# Patient Record
Sex: Female | Born: 1935 | Race: White | Hispanic: No | Marital: Single | Smoking: Current every day smoker
Health system: Southern US, Community
[De-identification: ages and names within clinical notes are randomized; demographics above are authoritative.]

## PROBLEM LIST (undated history)

## (undated) DIAGNOSIS — J449 Chronic obstructive pulmonary disease, unspecified: Secondary | ICD-10-CM

---

## 2011-02-16 ENCOUNTER — Encounter: Payer: Self-pay | Admitting: Adult Health

## 2011-02-16 ENCOUNTER — Emergency Department (HOSPITAL_COMMUNITY)
Admission: EM | Admit: 2011-02-16 | Discharge: 2011-02-16 | Disposition: A | Discharge status: Home / Self Care | Payer: Medicare PPO | Attending: Emergency Medicine | Admitting: Emergency Medicine

## 2011-02-16 DIAGNOSIS — R6889 Other general symptoms and signs: Secondary | ICD-10-CM | POA: Insufficient documentation

## 2011-02-16 DIAGNOSIS — J4489 Other specified chronic obstructive pulmonary disease: Secondary | ICD-10-CM | POA: Insufficient documentation

## 2011-02-16 DIAGNOSIS — R51 Headache: Secondary | ICD-10-CM | POA: Insufficient documentation

## 2011-02-16 DIAGNOSIS — J449 Chronic obstructive pulmonary disease, unspecified: Secondary | ICD-10-CM | POA: Insufficient documentation

## 2011-02-16 HISTORY — DX: Chronic obstructive pulmonary disease, unspecified: J44.9

## 2011-02-16 MED ORDER — TRAMADOL HCL 50 MG PO TABS: 50.0000 mg | ORAL_TABLET | Freq: Four times a day (QID) | ORAL | Status: AC | PRN

## 2011-02-16 MED ORDER — METOCLOPRAMIDE HCL 10 MG PO TABS: 10.0000 mg | ORAL_TABLET | Freq: Four times a day (QID) | ORAL | Status: AC

## 2011-02-16 NOTE — ED Provider Notes (Signed)
History     CSN: [id number] Arrival date & time: 02/16/2011 12:07 PM   First MD Initiated Contact with Patient 02/16/11 1209      Chief Complaint  Patient presents with  . Ear Fullness  . Headache    (Consider location/radiation/quality/duration/timing/severity/associated sxs/prior treatment) HPI 77 year old female had onset last night of severe pain in the right side of her scalp posteriorly. The pain is sharp. Current pain is 7/10 but it has been as severe as 10 out of 10. Any kind of pressure makes the pain worse-even just brushing her hair. Nothing seems to make it any better. She has not taken any medication for it. She's also had a cold for the last several days with some slight rhinorrhea and a nonproductive cough. There's been no fever, chills, sweats. She has had no hearing loss. There's been no tinnitus. Past Medical History  Diagnosis Date  . COPD (chronic obstructive pulmonary disease)   . Asthma     History reviewed. No pertinent past surgical history.  History reviewed. No pertinent family history.  History  Substance Use Topics  . Smoking status: Current Everyday Smoker  . Smokeless tobacco: Not on file  . Alcohol Use: No    OB History    Grav Para Term Preterm Abortions TAB SAB Ect Mult Living                  Review of Systems  Allergies  Codeine; Penicillins; Percocet; Sulfa antibiotics; and Vicodin  Home Medications   Current Outpatient Rx  Name Route Sig Dispense Refill  . ALBUTEROL SULFATE (2.5 MG/3ML) 0.083% IN NEBU Nebulization Take 2.5 mg by nebulization every 6 (six) hours as needed.      Marland Kitchen TIOTROPIUM BROMIDE MONOHYDRATE 18 MCG IN CAPS Inhalation Place 18 mcg into inhaler and inhale daily.      . TRAMADOL HCL 50 MG PO TABS Oral Take 1 tablet (50 mg total) by mouth every 6 (six) hours as needed for pain. Maximum dose= 8 tablets per day 20 tablet 0    BP 128/63  Pulse 72  Temp(Src) 98.1 F (36.7 C) (Oral)  Resp 20  SpO2  95%  Physical Exam 77 year old female in no acute distress. Vital signs are normal. Head is normocephalic and atraumatic. No rashes seen on the scalp, but tenderness is noted just partying her hair to try and look at the scalp. TMs are clear. Oropharynx is clear. Neck is supple without adenopathy or JVD. Lungs are clear without rales, wheezes, or rhonchi. Heart has regular rate and rhythm. Abdomen is soft, flat, nontender without masses or hepatosplenomegaly. Extremities have no cyanosis or edema, full range of motion present. Skin is without rashes. Neurologic: Mental status is normal, cranial nerves are intact, there no motor or sensory deficits. ED Course  Procedures (including critical care time)  Labs Reviewed - No data to display No results found.   1. Pain of scalp       MDM  Neuritic type pain which could be related to a radiculopathy or early shingles oh before the rash appears.        Dione Booze, MD 02/16/11 [phone]

## 2017-06-28 ENCOUNTER — Encounter (HOSPITAL_BASED_OUTPATIENT_CLINIC_OR_DEPARTMENT_OTHER): Payer: Self-pay | Admitting: Emergency Medicine

## 2017-06-28 ENCOUNTER — Emergency Department (HOSPITAL_BASED_OUTPATIENT_CLINIC_OR_DEPARTMENT_OTHER)
Admission: EM | Admit: 2017-06-28 | Discharge: 2017-06-28 | Disposition: A | Discharge status: Home / Self Care | Payer: Medicare HMO | Attending: Emergency Medicine | Admitting: Emergency Medicine

## 2017-06-28 ENCOUNTER — Other Ambulatory Visit: Payer: Self-pay

## 2017-06-28 ENCOUNTER — Emergency Department (HOSPITAL_BASED_OUTPATIENT_CLINIC_OR_DEPARTMENT_OTHER): Payer: Medicare HMO

## 2017-06-28 DIAGNOSIS — W010XXA Fall on same level from slipping, tripping and stumbling without subsequent striking against object, initial encounter: Secondary | ICD-10-CM | POA: Insufficient documentation

## 2017-06-28 DIAGNOSIS — S6982XA Other specified injuries of left wrist, hand and finger(s), initial encounter: Secondary | ICD-10-CM | POA: Diagnosis present

## 2017-06-28 DIAGNOSIS — S52592A Other fractures of lower end of left radius, initial encounter for closed fracture: Secondary | ICD-10-CM | POA: Diagnosis not present

## 2017-06-28 DIAGNOSIS — F17210 Nicotine dependence, cigarettes, uncomplicated: Secondary | ICD-10-CM | POA: Insufficient documentation

## 2017-06-28 DIAGNOSIS — Y999 Unspecified external cause status: Secondary | ICD-10-CM | POA: Insufficient documentation

## 2017-06-28 DIAGNOSIS — Y929 Unspecified place or not applicable: Secondary | ICD-10-CM | POA: Insufficient documentation

## 2017-06-28 DIAGNOSIS — Y939 Activity, unspecified: Secondary | ICD-10-CM | POA: Insufficient documentation

## 2017-06-28 DIAGNOSIS — J449 Chronic obstructive pulmonary disease, unspecified: Secondary | ICD-10-CM | POA: Insufficient documentation

## 2017-06-28 DIAGNOSIS — Z79899 Other long term (current) drug therapy: Secondary | ICD-10-CM | POA: Diagnosis not present

## 2017-06-28 MED ORDER — ACETAMINOPHEN 500 MG PO TABS
1000.0000 mg | ORAL_TABLET | Freq: Once | ORAL | Status: AC
  Administered 2017-06-28: 1000 mg via ORAL
  Filled 2017-06-28: qty 2

## 2017-06-28 NOTE — ED Notes (Signed)
No other signs of obvious trauma on head to toe exam except for what's documented below.

## 2017-06-28 NOTE — ED Notes (Signed)
Padded board splint applied to LUE

## 2017-06-28 NOTE — ED Provider Notes (Signed)
[facility] EMERGENCY DEPARTMENT Provider Note   CSN: [id number] Arrival date & time: 06/28/17  1231     History   Chief Complaint Chief Complaint  Patient presents with  . Fall    HPI Whitney Hunter is a 84 y.o. female.  Pt presents to the ED today with fall and left wrist pain.  The pt said she tried to catch herself with her left wrist and landed on it.  She denies any other injury.  She thinks her foot got caught in the rug.  She did not hit her head.  No loc.  No blood thinners.     Past Medical History:  Diagnosis Date  . Asthma   . COPD (chronic obstructive pulmonary disease) (HCC)     There are no active problems to display for this patient.   History reviewed. No pertinent surgical history.   OB History   None      Home Medications    Prior to Admission medications   Medication Sig Start Date End Date Taking? Authorizing Provider  acetaminophen (TYLENOL ARTHRITIS PAIN) 650 MG CR tablet Take 650 mg by mouth every 8 (eight) hours as needed. For pain. [employer] brand     [provider]  albuterol (PROVENTIL) (2.5 MG/3ML) 0.083% nebulizer solution Take 2.5 mg by nebulization every 6 (six) hours as needed.      [provider]  calcium-vitamin D (OSCAL WITH D) 500-200 MG-UNIT per tablet Take 1 tablet by mouth daily.      [provider]  fish oil-omega-3 fatty acids 1000 MG capsule Take 3 g by mouth 2 (two) times daily.      [provider]  FLAXSEED, LINSEED, PO Take 1 tablet by mouth daily.      [provider]  glucosamine-chondroitin 500-400 MG tablet Take 1 tablet by mouth daily.      [provider]  Multiple Vitamin (MULITIVITAMIN WITH MINERALS) TABS Take 1 tablet by mouth daily.      [provider]  tiotropium (SPIRIVA) 18 MCG inhalation capsule Place 18 mcg into inhaler and inhale daily.      [provider]  vitamin C (ASCORBIC ACID) 500 MG tablet Take 500 mg by mouth  daily.      [provider]    Family History History reviewed. No pertinent family history.  Social History Social History   Tobacco Use  . Smoking status: Current Every Day Smoker  Substance Use Topics  . Alcohol use: No  . Drug use: Not on file     Allergies   Codeine; Penicillins; Percocet [oxycodone-acetaminophen]; Sulfa antibiotics; and Vicodin [hydrocodone-acetaminophen]   Review of Systems Review of Systems  Musculoskeletal:       Left wrist deformity and pain  All other systems reviewed and are negative.    Physical Exam Updated Vital Signs BP (!) 159/84 (BP Location: Right Arm)   Pulse 74   Temp 98.2 F (36.8 C) (Oral)   Resp 16   Ht 5' (1.524 m)   Wt 54.4 kg (120 lb)   SpO2 97%   BMI 23.44 kg/m   Physical Exam  Constitutional: She is oriented to person, place, and time. She appears well-developed and well-nourished.  HENT:  Head: Normocephalic and atraumatic.  Right Ear: External ear normal.  Left Ear: External ear normal.  Nose: Nose normal.  Mouth/Throat: Oropharynx is clear and moist.  Eyes: Pupils are equal, round, and reactive to light. Conjunctivae and EOM are normal.  Neck: Normal range of motion. Neck supple.  Cardiovascular: Normal rate, regular rhythm, normal heart sounds and intact distal pulses.  Pulmonary/Chest: Effort normal and breath sounds normal.  Abdominal: Soft. Bowel sounds are normal.  Musculoskeletal:       Left wrist: She exhibits bony tenderness, swelling and deformity.  Neurological: She is alert and oriented to person, place, and time.  Skin: Skin is warm. Capillary refill takes less than 2 seconds.  Psychiatric: She has a normal mood and affect. Her behavior is normal. Judgment and thought content normal.  Nursing note and vitals reviewed.    ED Treatments / Results  Labs (all labs ordered are listed, but only abnormal results are displayed) Labs Reviewed - No data to  display  EKG None  Radiology Dg Wrist Complete Left  Result Date: 06/28/2017 CLINICAL DATA:  Fall EXAM: LEFT WRIST - COMPLETE 3+ VIEW COMPARISON:  None. FINDINGS: Osteopenia. Acute fracture of the distal radius. It is transverse and across the proximal metaphysis. There is dorsal angulation of the distal radius articular surface. Ulna is intact. Scaphoid is grossly intact. IMPRESSION: Acute distal radius fracture. Electronically Signed   By: Jolaine Click M.D.   On: 06/28/2017 13:58    Procedures .Splint Application Date/Time: 06/28/2017 2:22 PM Performed by: Jacalyn Lefevre, MD Authorized by: Jacalyn Lefevre, MD   Consent:    Consent obtained:  Verbal   Consent given by:  Patient   Risks discussed:  Discoloration, numbness, pain and swelling   Alternatives discussed:  No treatment Pre-procedure details:    Sensation:  Normal Procedure details:    Laterality:  Left   Location:  Wrist   Wrist:  L wrist   Strapping: no     Splint type:  Sugar tong   Supplies:  Sling Post-procedure details:    Pain:  Improved   Patient tolerance of procedure:  Tolerated well, no immediate complications   (including critical care time)  Medications Ordered in ED Medications  acetaminophen (TYLENOL) tablet 1,000 mg (1,000 mg Oral Given 06/28/17 1400)     Initial Impression / Assessment and Plan / ED Course  I have reviewed the triage vital signs and the nursing notes.  Pertinent labs & imaging results that were available during my care of the patient were reviewed by me and considered in my medical decision making (see chart for details).     Pt did not want anything for pain other than tylenol, so she was given 1 g.  The pt was placed in a sugar tong splint with sling.  She has an orthopedist in [place] and wants to f/u with that doctor.  She knows to return if worse.  Final Clinical Impressions(s) / ED Diagnoses   Final diagnoses:  Other closed fracture of distal end of left radius,  initial encounter    ED Discharge Orders    None       Jacalyn Lefevre, MD 06/28/17 1422

## 2017-06-28 NOTE — ED Triage Notes (Signed)
Patient states that she fell this am and hurt her right wrist on her dresser - arm is splinted at this time

## 2019-02-05 IMAGING — DX DG WRIST COMPLETE 3+V*L*
3 series · 3 of 3 positions shown · non-contrast
Comparison: None.

CLINICAL DATA: Fall

EXAM:
LEFT WRIST - COMPLETE 3+ VIEW

[wrist pa]
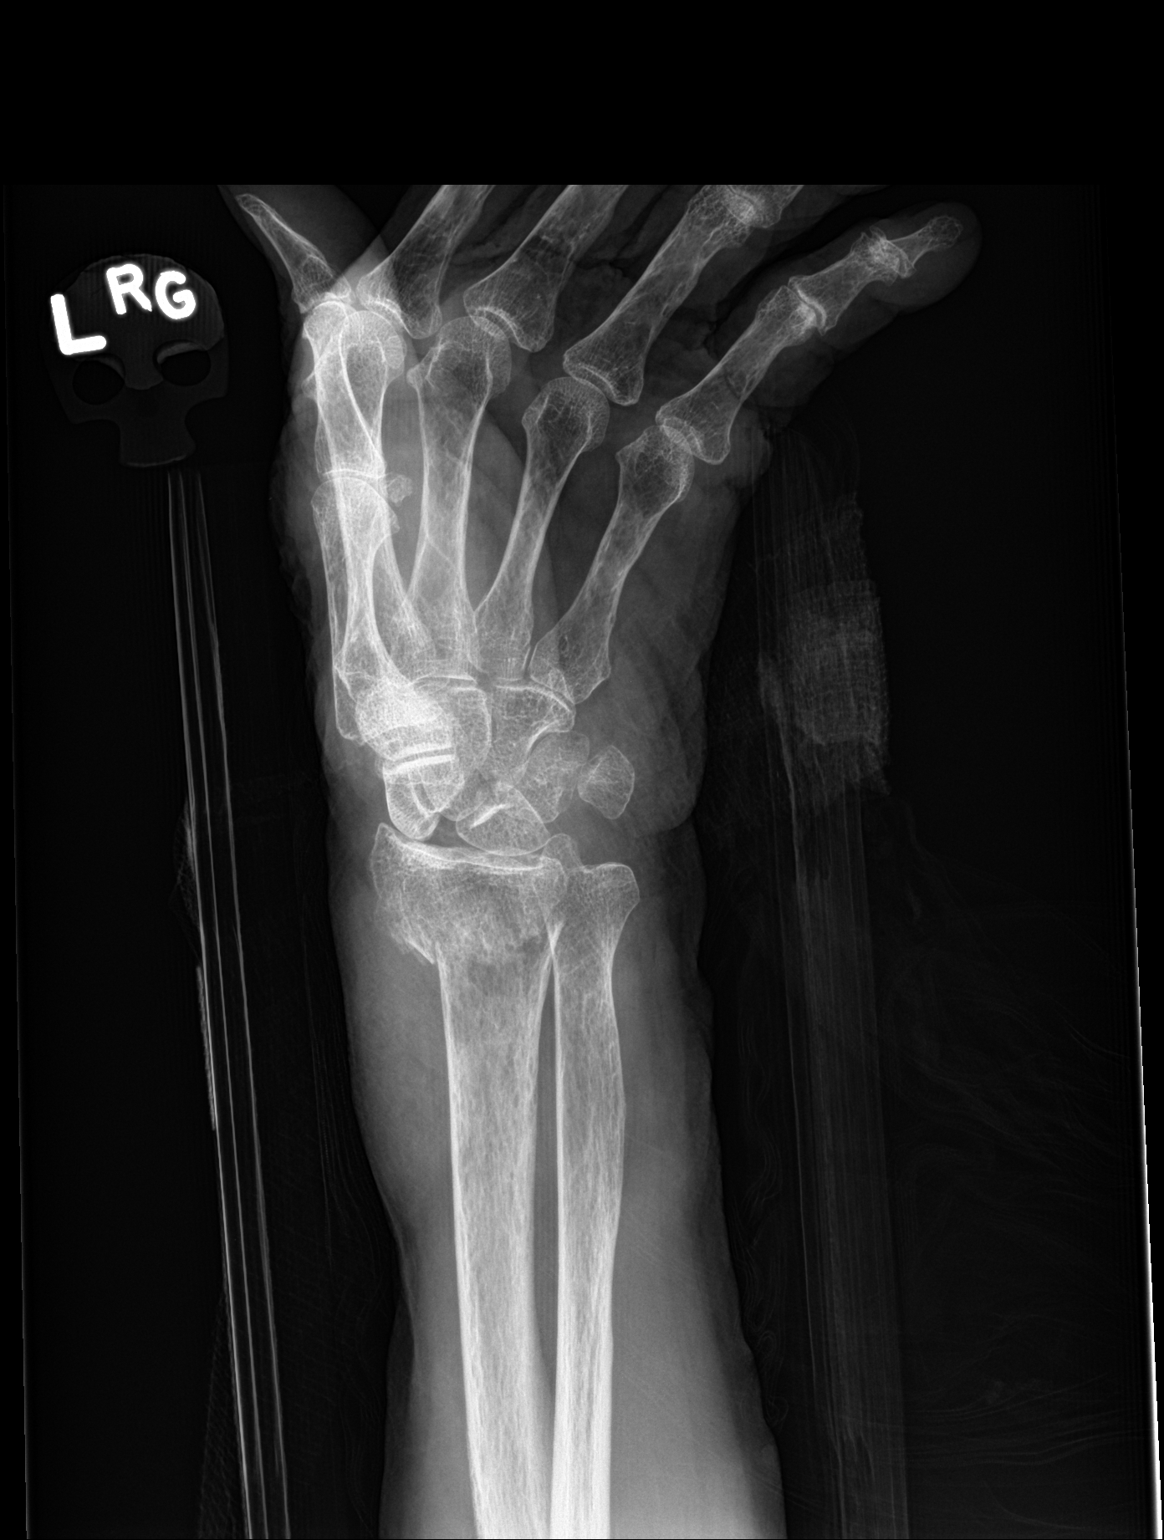

[wrist obl]
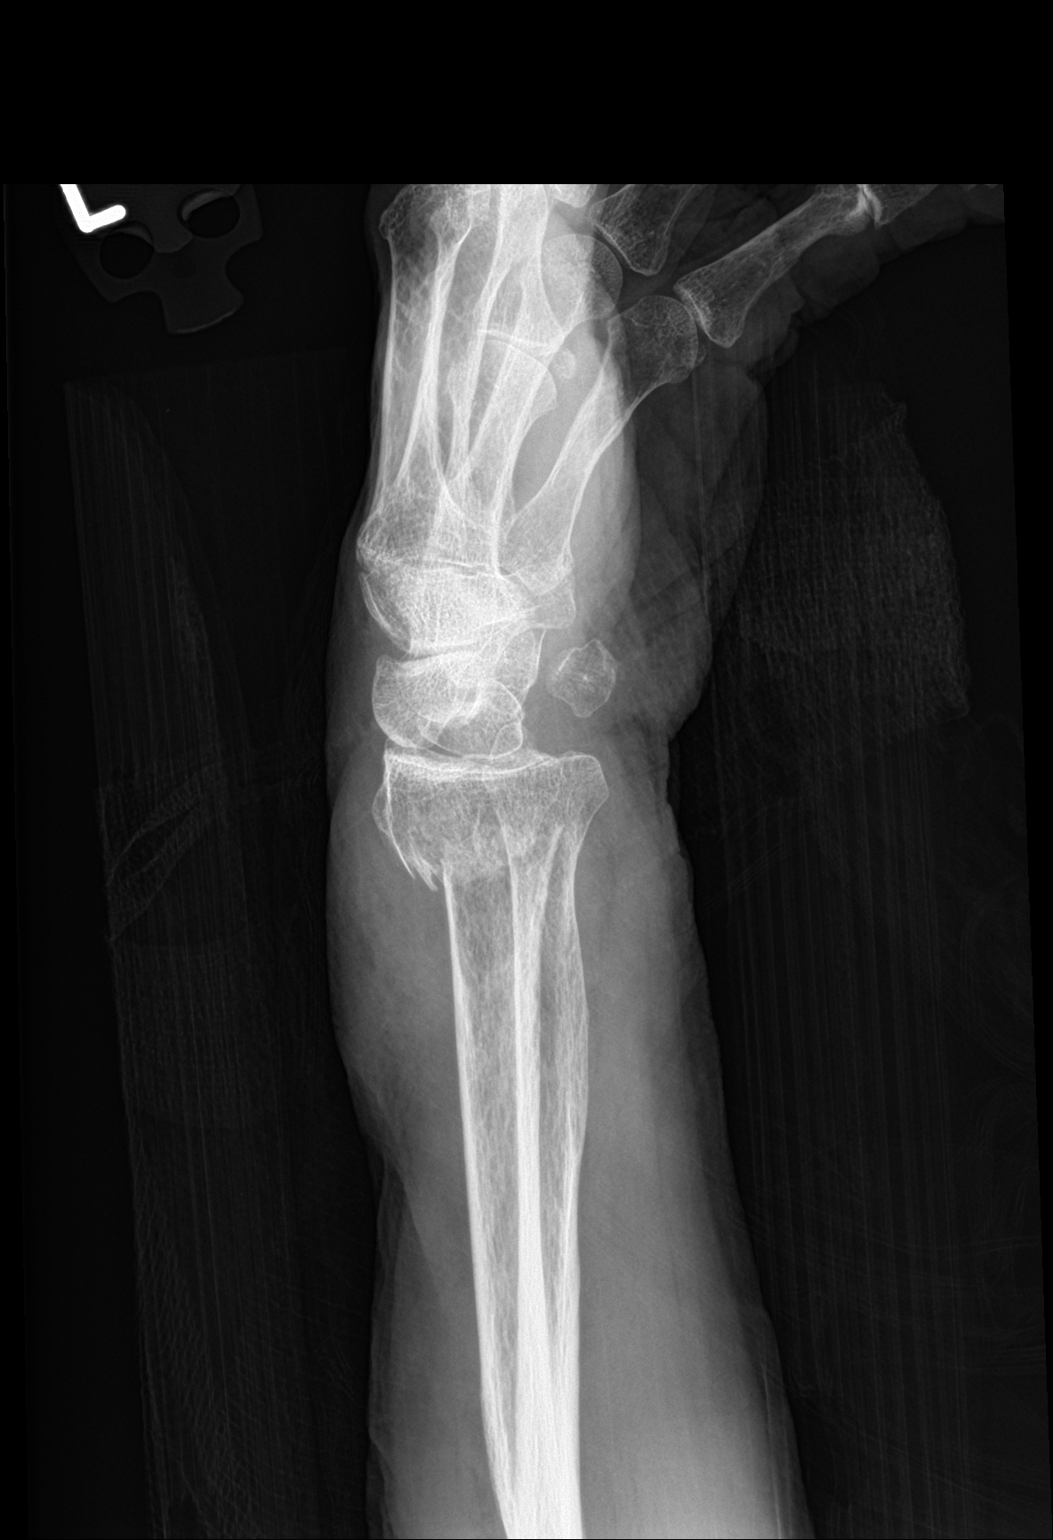

[wrist lat]
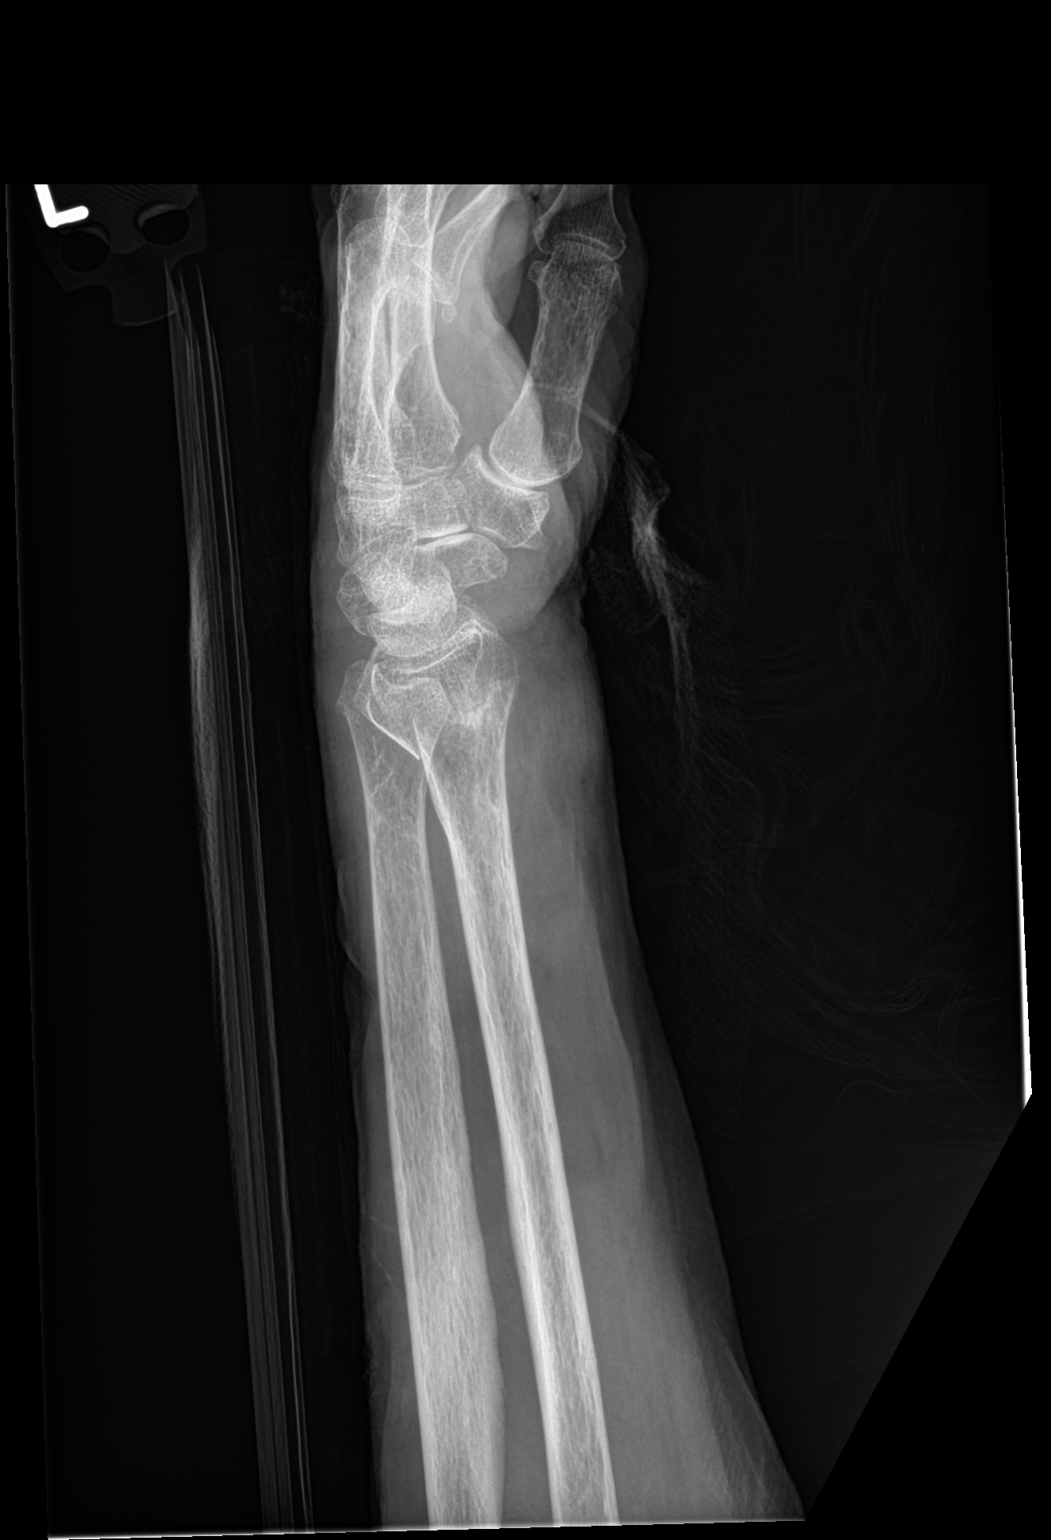

[3 of 3 positions shown; findings below may reference images not displayed]

FINDINGS: Osteopenia. Acute fracture of the distal radius. It is transverse
and across the proximal metaphysis. There is dorsal angulation of
the distal radius articular surface. Ulna is intact. Scaphoid is
grossly intact.
IMPRESSION: Acute distal radius fracture.
